# Patient Record
Sex: Male | Born: 1985 | Race: Black or African American | Hispanic: No | Marital: Single | Smoking: Never smoker
Health system: Southern US, Community
[De-identification: ages and names within clinical notes are randomized; demographics above are authoritative.]

---

## 2008-12-17 ENCOUNTER — Emergency Department (HOSPITAL_COMMUNITY): Admission: EM | Admit: 2008-12-17 | Discharge: 2008-12-17 | Payer: Self-pay | Admitting: Family Medicine

## 2010-07-24 ENCOUNTER — Inpatient Hospital Stay (INDEPENDENT_AMBULATORY_CARE_PROVIDER_SITE_OTHER)
Admission: RE | Admit: 2010-07-24 | Discharge: 2010-07-24 | Disposition: A | Discharge status: Home / Self Care | Payer: Self-pay | Source: Ambulatory Visit | Attending: Emergency Medicine | Admitting: Emergency Medicine

## 2010-07-24 DIAGNOSIS — A64 Unspecified sexually transmitted disease: Secondary | ICD-10-CM

## 2010-07-27 LAB — GC/CHLAMYDIA PROBE AMP, GENITAL: Chlamydia, DNA Probe: NEGATIVE

## 2011-06-10 ENCOUNTER — Encounter: Payer: Self-pay | Admitting: Emergency Medicine

## 2011-06-10 ENCOUNTER — Emergency Department
Admission: EM | Admit: 2011-06-10 | Discharge: 2011-06-10 | Disposition: A | Discharge status: Home / Self Care | Payer: Commercial Managed Care - PPO | Source: Home / Self Care | Attending: Family Medicine | Admitting: Family Medicine

## 2011-06-10 DIAGNOSIS — Z113 Encounter for screening for infections with a predominantly sexual mode of transmission: Secondary | ICD-10-CM

## 2011-06-10 NOTE — ED Notes (Signed)
No exposure known but has STD checks q 6 months.

## 2011-06-10 NOTE — ED Provider Notes (Signed)
History     CSN: [id number]  Arrival date & time 06/10/11  1908   First MD Initiated Contact with Patient 06/10/11 1935      Chief Complaint  Patient presents with  . Exposure to STD      HPI Comments: Patient presents for STD testing.  He is completely assymptomatic at present and states that he has a routing GC/chlamydia test every 6 months.  He had a negative HIV test about 8 months ago, and has had negative HSV testing in the past.  He has a regular sexual partner who has also had negative STD testing. He feels well.  Patient is a 26 y.o. male presenting with STD exposure. The history is provided by the patient.  Exposure to STD This is a new problem. Episode frequency: Assymptomatic.    History reviewed. No pertinent past medical history.  History reviewed. No pertinent past surgical history.  History reviewed. No pertinent family history.  History  Substance Use Topics  . Smoking status: Never Smoker   . Smokeless tobacco: Not on file  . Alcohol Use: No      Review of Systems  Genitourinary: Negative for dysuria, urgency, frequency, hematuria, discharge, scrotal swelling, difficulty urinating, genital sores, penile pain and testicular pain.  All other systems reviewed and are negative.    Allergies  Penicillins  Home Medications  No current outpatient prescriptions on file.  BP 150/82  Pulse 60  Temp(Src) 98.1 F (36.7 C) (Oral)  Resp 18  Ht 6\' 2"  (1.88 m)  Wt 215 lb (97.523 kg)  BMI 27.60 kg/m2  SpO2 98%  Physical Exam Nursing notes and Vital Signs reviewed. Appearance:  Patient appears healthy, stated age, and in no acute distress Not examined otherwise  ED Course  Procedures none   Labs Reviewed  GC/CHLAMYDIA PROBE AMP, URINE pending      1. Screen for STD (sexually transmitted disease)       MDM  GC/chlamydia pending.  Patient declines other testing        Donna Christen, MD 06/10/11 [phone]

## 2011-06-11 LAB — GC/CHLAMYDIA PROBE AMP, URINE
Chlamydia, Swab/Urine, PCR: NEGATIVE
GC Probe Amp, Urine: NEGATIVE

## 2011-06-14 ENCOUNTER — Telehealth: Payer: Self-pay | Admitting: Emergency Medicine

## 2012-04-02 ENCOUNTER — Emergency Department (HOSPITAL_COMMUNITY)
Admission: EM | Admit: 2012-04-02 | Discharge: 2012-04-02 | Disposition: A | Discharge status: Home / Self Care | Payer: Commercial Managed Care - PPO | Attending: Emergency Medicine | Admitting: Emergency Medicine

## 2012-04-02 ENCOUNTER — Encounter (HOSPITAL_COMMUNITY): Payer: Self-pay | Admitting: Emergency Medicine

## 2012-04-02 ENCOUNTER — Emergency Department (HOSPITAL_COMMUNITY): Payer: Commercial Managed Care - PPO

## 2012-04-02 DIAGNOSIS — W03XXXA Other fall on same level due to collision with another person, initial encounter: Secondary | ICD-10-CM | POA: Insufficient documentation

## 2012-04-02 DIAGNOSIS — S9305XA Dislocation of left ankle joint, initial encounter: Secondary | ICD-10-CM

## 2012-04-02 DIAGNOSIS — S9306XA Dislocation of unspecified ankle joint, initial encounter: Secondary | ICD-10-CM | POA: Insufficient documentation

## 2012-04-02 DIAGNOSIS — Y9367 Activity, basketball: Secondary | ICD-10-CM | POA: Insufficient documentation

## 2012-04-02 DIAGNOSIS — Y9239 Other specified sports and athletic area as the place of occurrence of the external cause: Secondary | ICD-10-CM | POA: Insufficient documentation

## 2012-04-02 MED ORDER — PROPOFOL 10 MG/ML IV BOLUS
200.0000 mg | Freq: Once | INTRAVENOUS | Status: DC
  Filled 2012-04-02: qty 20

## 2012-04-02 MED ORDER — KETAMINE HCL 10 MG/ML IJ SOLN: 200.0000 mg | Freq: Once | INTRAMUSCULAR | Status: DC

## 2012-04-02 MED ORDER — HYDROMORPHONE HCL PF 1 MG/ML IJ SOLN
1.0000 mg | Freq: Once | INTRAMUSCULAR | Status: AC
  Administered 2012-04-02: 1 mg via INTRAVENOUS
  Filled 2012-04-02: qty 1

## 2012-04-02 MED ORDER — BUPIVACAINE HCL 0.25 % IJ SOLN: 50.0000 mL | Freq: Once | INTRAMUSCULAR | Status: DC

## 2012-04-02 MED ORDER — OXYCODONE-ACETAMINOPHEN 5-325 MG PO TABS: 1.0000 | ORAL_TABLET | Freq: Four times a day (QID) | ORAL | Status: DC | PRN

## 2012-04-02 MED ORDER — PROPOFOL 10 MG/ML IV BOLUS
INTRAVENOUS | Status: AC | PRN
  Administered 2012-04-02: 50 mg via INTRAVENOUS

## 2012-04-02 MED ORDER — BUPIVACAINE HCL (PF) 0.5 % IJ SOLN
INTRAMUSCULAR | Status: AC
  Administered 2012-04-02: 50 mg
  Filled 2012-04-02: qty 40

## 2012-04-02 MED ORDER — IBUPROFEN 800 MG PO TABS: 800.0000 mg | ORAL_TABLET | Freq: Three times a day (TID) | ORAL | Status: DC

## 2012-04-02 MED ORDER — KETAMINE HCL 10 MG/ML IJ SOLN
INTRAMUSCULAR | Status: AC | PRN
  Administered 2012-04-02: 50 mg via INTRAVENOUS

## 2012-04-02 NOTE — Progress Notes (Signed)
Orthopedic Tech Progress Note Patient Details:  Todd Thornton [birth year] [id number]  Ortho Devices Type of Ortho Device: Crutches;Stirrup splint;Post (short) splint;Ace wrap Splint Material: Fiberglass Ortho Device/Splint Location: (L) LE Ortho Device/Splint Interventions: Application   [name] 04/02/2012, 8:36 PM

## 2012-04-02 NOTE — ED Notes (Signed)
Patient playing basketball and fell because another patient went under patient. Left ankle foot obvious deformity. EMS called IV established given 200 mcg fentanyl IV and pain currently 9/10 achy sharp. Pedal pulses +2 able to slight move toes skin warm dry.

## 2012-04-02 NOTE — ED Provider Notes (Signed)
History     CSN: [id number]  Arrival date & time 04/02/12  4098   First MD Initiated Contact with Patient 04/02/12 1855      Chief Complaint  Patient presents with  . Foot Injury    (Consider location/radiation/quality/duration/timing/severity/associated sxs/prior treatment) HPI Comments: Patient was playing basketball and came down on someone else's foot which caused an inversion injury to his left ankle. He has not been able to weight-bear since then. He has an obvious deformity to his left ankle. Skin intact  Patient is a 26 y.o. male presenting with foot injury. The history is provided by the patient. No language interpreter was used.  Foot Injury  The incident occurred less than 1 hour ago. The incident occurred at the gym. The injury mechanism was a fall. The pain is present in the left ankle. The quality of the pain is described as throbbing. The pain is at a severity of 9/10. The pain is severe. The pain has been constant since onset. Associated symptoms include inability to bear weight. Pertinent negatives include no numbness, no loss of sensation and no tingling. He reports no foreign bodies present. The symptoms are aggravated by bearing weight, palpation and activity. He has tried nothing (fentanyl by ems) for the symptoms. The treatment provided no relief.    History reviewed. No pertinent past medical history.  History reviewed. No pertinent past surgical history.  No family history on file.  History  Substance Use Topics  . Smoking status: Never Smoker   . Smokeless tobacco: Not on file  . Alcohol Use: No      Review of Systems  Constitutional: Negative for diaphoresis, activity change and appetite change.  HENT: Negative for sore throat and neck pain.   Eyes: Negative for discharge and visual disturbance.  Respiratory: Negative for cough, choking and shortness of breath.   Cardiovascular: Negative for chest pain and leg swelling.  Gastrointestinal: Negative  for nausea, vomiting, abdominal pain, diarrhea and constipation.  Genitourinary: Negative for dysuria and difficulty urinating.  Musculoskeletal: Positive for joint swelling, arthralgias and gait problem. Negative for back pain.  Skin: Negative for color change and pallor.  Neurological: Negative for dizziness, tingling, speech difficulty, light-headedness and numbness.  Psychiatric/Behavioral: Negative for behavioral problems and agitation.  All other systems reviewed and are negative.    Allergies  Penicillins  Home Medications  No current outpatient prescriptions on file.  BP 124/74  Pulse 66  Temp 97.9 F (36.6 C) (Oral)  Resp 18  SpO2 99%  Physical Exam  Constitutional: He appears well-developed. No distress.  HENT:  Head: Normocephalic and atraumatic.  Mouth/Throat: No oropharyngeal exudate.  Eyes: EOM are normal. Pupils are equal, round, and reactive to light. Right eye exhibits no discharge. Left eye exhibits no discharge.  Neck: Normal range of motion. Neck supple. No JVD present.  Cardiovascular: Normal rate, regular rhythm and normal heart sounds.   Pulmonary/Chest: Effort normal and breath sounds normal. No stridor. No respiratory distress. He has no wheezes. He has no rales. He exhibits no tenderness.  Abdominal: Soft. Bowel sounds are normal. There is no tenderness. There is no guarding.  Genitourinary: Penis normal.  Musculoskeletal: He exhibits tenderness. He exhibits no edema.       Obvious deformity to left ankle, ankle is medially displaced with lateral malleolus protruding, skin intact over it. 2+ dorsalis pedis, 1+ posterior tibial pulse. Sensation intact. Able to wiggle toes. Full range of motion of left knee, no tenderness over the fibular head.  Neurological: He is alert. No cranial nerve deficit. He exhibits normal muscle tone.  Skin: Skin is warm and dry. He is not diaphoretic. No erythema. No pallor.  Psychiatric: He has a normal mood and affect. His  behavior is normal. Judgment and thought content normal.    ED Course  NERVE BLOCK Performed by: Warrick Parisian Authorized by: Warrick Parisian Consent: Verbal consent obtained. Indications: pain relief and dislocation Body area: lower extremity Nerve: deep peroneal Laterality: left Patient sedated: no Patient position: prone Needle gauge: 18 G Location technique: anatomical landmarks and ultrasound guidance Local anesthetic: bupivacaine 0.5% without epinephrine Anesthetic total: 30 ml Outcome: pain unchanged Patient tolerance: Patient tolerated the procedure well with no immediate complications.  ORTHOPEDIC INJURY TREATMENT Performed by: Warrick Parisian Authorized by: Warrick Parisian Consent: Written consent obtained. Risks and benefits: risks, benefits and alternatives were discussed Time out: Immediately prior to procedure a "time out" was called to verify the correct patient, procedure, equipment, support staff and site/side marked as required. Injury location: ankle Location details: left ankle Injury type: dislocation Dislocation type: lateral Pre-procedure neurovascular assessment: neurovascularly intact Pre-procedure distal perfusion: normal Pre-procedure neurological function: normal Pre-procedure range of motion: reduced Local anesthesia used: no Patient sedated: yes Sedation type: moderate (conscious) sedation Sedatives: propofol and ketamine Analgesia: ketamine Vitals: Vital signs were monitored during sedation. Manipulation performed: yes Reduction method: direct traction Reduction successful: yes X-ray confirmed reduction: yes Immobilization: splint Splint type: ankle stirrup Supplies used: Ortho-Glass Post-procedure neurovascular assessment: post-procedure neurovascularly intact Post-procedure distal perfusion: normal Post-procedure neurological function: normal Post-procedure range of motion: normal Patient tolerance: Patient tolerated the procedure  well with no immediate complications.  Procedural sedation Performed by: Warrick Parisian Authorized by: Warrick Parisian Consent: Verbal consent obtained. Risks and benefits: risks, benefits and alternatives were discussed Time out: Immediately prior to procedure a "time out" was called to verify the correct patient, procedure, equipment, support staff and site/side marked as required. Patient sedated: yes Sedation type: moderate (conscious) sedation Sedatives: ketamine and propofol Analgesia: ketamine Vitals: Vital signs were monitored during sedation. Patient tolerance: Patient tolerated the procedure well with no immediate complications.   (including critical care time)  Labs Reviewed - No data to display Dg Ankle 2 Views Left  04/02/2012  *RADIOLOGY REPORT*  Clinical Data: Injured ankle playing basketball.  LEFT ANKLE - 2 VIEW  Comparison: None.  Findings: Limited examination due to positioning. The medial subtalar dislocation is most likely.  No obvious fractures.  IMPRESSION: Subtalar dislocation.   Original Report Authenticated By: Rudie Meyer, M.D.      1. Closed dislocation of left ankle       MDM  The regional nerve block but patient did not have any relief of pain or decreased sensation afterwards. Procedural sedation was performed which the patient tolerated well, he also tolerated ankle reduction while. Successful reduction confirmed by postprocedural x-ray. No fracture. Splint placed, orthopedic surgery followup arranged. Neurovascularly intact before and after the reduction. Skin was closed over injury. Pt deemed stable for discharge. Return precautions were provided and pt expressed understanding to return to ED if any acute symptoms return. Follow up was instructed which pt also expressed understanding. All questions were answered and pt was in agreement w/ plan.         Warrick Parisian, MD 04/02/12 [phone]

## 2012-04-02 NOTE — ED Notes (Signed)
Tammy Sours RN triaged, obtained vital signs, and assessed patient during triage.

## 2012-04-12 NOTE — ED Provider Notes (Signed)
I saw and evaluated the patient, reviewed the resident's note and I agree with the findings and plan.  26 year old male with closed ankle dislocation. This was reduced. Patient splinted. Neurovascular intact after reduction. Repeat imaging confirms adequate reduction. Outpatient followup with orthopedic surgery.  Raeford Razor, MD 04/12/12 [phone]

## 2012-04-12 NOTE — ED Provider Notes (Signed)
I saw and evaluated the patient, reviewed the resident's note and I agree with the findings and plan.  Please see my completed note for this encounter.  Raeford Razor, MD 04/12/12 [phone]

## 2013-10-25 ENCOUNTER — Emergency Department (HOSPITAL_COMMUNITY): Payer: 59

## 2013-10-25 ENCOUNTER — Encounter (HOSPITAL_COMMUNITY): Payer: Self-pay | Admitting: Emergency Medicine

## 2013-10-25 ENCOUNTER — Emergency Department (HOSPITAL_COMMUNITY)
Admission: EM | Admit: 2013-10-25 | Discharge: 2013-10-26 | Disposition: A | Discharge status: Home / Self Care | Payer: 59 | Attending: Emergency Medicine | Admitting: Emergency Medicine

## 2013-10-25 DIAGNOSIS — S82899A Other fracture of unspecified lower leg, initial encounter for closed fracture: Secondary | ICD-10-CM | POA: Insufficient documentation

## 2013-10-25 DIAGNOSIS — S93315A Dislocation of tarsal joint of left foot, initial encounter: Secondary | ICD-10-CM

## 2013-10-25 DIAGNOSIS — S82892A Other fracture of left lower leg, initial encounter for closed fracture: Secondary | ICD-10-CM

## 2013-10-25 DIAGNOSIS — Y92838 Other recreation area as the place of occurrence of the external cause: Secondary | ICD-10-CM

## 2013-10-25 DIAGNOSIS — S9306XA Dislocation of unspecified ankle joint, initial encounter: Secondary | ICD-10-CM | POA: Insufficient documentation

## 2013-10-25 DIAGNOSIS — Y9367 Activity, basketball: Secondary | ICD-10-CM | POA: Insufficient documentation

## 2013-10-25 DIAGNOSIS — Z880 Allergy status to penicillin: Secondary | ICD-10-CM | POA: Insufficient documentation

## 2013-10-25 DIAGNOSIS — Y9239 Other specified sports and athletic area as the place of occurrence of the external cause: Secondary | ICD-10-CM | POA: Insufficient documentation

## 2013-10-25 DIAGNOSIS — Z791 Long term (current) use of non-steroidal anti-inflammatories (NSAID): Secondary | ICD-10-CM | POA: Insufficient documentation

## 2013-10-25 DIAGNOSIS — X500XXA Overexertion from strenuous movement or load, initial encounter: Secondary | ICD-10-CM | POA: Insufficient documentation

## 2013-10-25 MED ORDER — PROPOFOL 10 MG/ML IV BOLUS
0.5000 mg/kg | Freq: Once | INTRAVENOUS | Status: AC
  Administered 2013-10-25: 54.5 mg via INTRAVENOUS
  Filled 2013-10-25: qty 1

## 2013-10-25 MED ORDER — PROPOFOL 10 MG/ML IV BOLUS
INTRAVENOUS | Status: AC | PRN
  Administered 2013-10-25: 50 mg via INTRAVENOUS

## 2013-10-25 MED ORDER — HYDROMORPHONE HCL PF 1 MG/ML IJ SOLN
1.0000 mg | Freq: Once | INTRAMUSCULAR | Status: AC
  Administered 2013-10-25: 1 mg via INTRAVENOUS
  Filled 2013-10-25: qty 1

## 2013-10-25 NOTE — ED Notes (Signed)
Per EMS: Pt was playing basketball, tripped on someone and rolled his L ankle. Deformity noted to L ankle. Prior fracture to L ankle.

## 2013-10-25 NOTE — ED Provider Notes (Signed)
CSN: [id number]     Arrival date & time 10/25/13  2033 History   First MD Initiated Contact with Patient 10/25/13 2048     Chief Complaint  Patient presents with  . Ankle Pain   HPI  Todd Thornton is a 28 y.o. male with no PMH who presents to the ED for evaluation of ankle pain. History was provided by the patient. Patient states he was playing basketball this evening [employer]TA and rolled his ankle. No other injuries. No numbness, tingling, loss of sensation or weakness. No head injury or LOC. Provided pain medication by EMS but this is starting to subside and his pain is returning. Has had an ankle fracture and dislocation on the left once in the past. No PMH.     History reviewed. No pertinent past medical history. History reviewed. No pertinent past surgical history. No family history on file. History  Substance Use Topics  . Smoking status: Never Smoker   . Smokeless tobacco: Not on file  . Alcohol Use: No    Review of Systems  Cardiovascular: Negative for leg swelling.  Musculoskeletal: Positive for arthralgias, gait problem and joint swelling.  Skin: Negative for color change and wound.  Neurological: Negative for weakness and numbness.    Allergies  Penicillins  Home Medications   Prior to Admission medications   Medication Sig Start Date End Date Taking? Authorizing Provider  ibuprofen (ADVIL,MOTRIN) 800 MG tablet Take 1 tablet (800 mg total) by mouth 3 (three) times daily. 04/02/12   Warrick Parisian, MD  oxyCODONE-acetaminophen (PERCOCET/ROXICET) 5-325 MG per tablet Take 1-2 tablets by mouth every 6 (six) hours as needed for pain. 04/02/12   Warrick Parisian, MD   BP 146/85  Pulse 83  Temp(Src) 98.2 F (36.8 C) (Oral)  Resp 16  Ht 6\' 3"  (1.905 m)  Wt 240 lb (108.863 kg)  BMI 30.00 kg/m2  SpO2 100%  Filed Vitals:   10/25/13 2236 10/25/13 2237 10/25/13 2238 10/26/13 0046  BP:    146/85  Pulse: 95 81 81 83  Temp:    98.2 F (36.8 C)  TempSrc:      Resp: 15 11 16 16    Height:      Weight:      SpO2: 100% 98% 100% 100%    Physical Exam  Nursing note and vitals reviewed. Constitutional: He is oriented to person, place, and time. He appears well-developed and well-nourished. No distress.  HENT:  Head: Normocephalic and atraumatic.  Right Ear: External ear normal.  Left Ear: External ear normal.  Mouth/Throat: Oropharynx is clear and moist.  Eyes: Conjunctivae are normal. Right eye exhibits no discharge. Left eye exhibits no discharge.  Neck: Normal range of motion.  Cardiovascular: Normal rate.   Dorsalis pedis pulses present and equal bilaterally  Pulmonary/Chest: Effort normal.  Abdominal: Soft.  Musculoskeletal: Normal range of motion. He exhibits edema and tenderness.  Obvious deformity to the left ankle. Left foot is displaced and angled inwards medially. Patient able to flex and extend digits of left foot. No foot or knee tenderness on the left. Mild edema to the left lateral ankle.   Neurological: He is alert and oriented to person, place, and time.  Sensation intact in the LE bilaterally  Skin: Skin is warm and dry. He is not diaphoretic.  No open wounds or lacerations     ED Course  Procedures (including critical care time) Labs Review Labs Reviewed - No data to display  Imaging Review Dg Ankle Complete Left  10/25/2013   CLINICAL DATA:  Trauma.  EXAM: LEFT ANKLE COMPLETE - 3+ VIEW  COMPARISON:  Left ankle series 11 07/2011.  FINDINGS: Deformity is noted about the left ankle. Fracture of the talus with talocalcaneal dislocation is suspected. CT left ankle is suggested for further evaluation.  IMPRESSION: Severe deformity left ankle. Fracture of the talus is suspected. Talocalcaneal dislocation is suspected. CT should be considered for further evaluation.   Electronically Signed   By: Maisie Fus  Register   On: 10/25/2013 21:28   Ct Ankle Left Wo Contrast  10/25/2013   CLINICAL DATA:  Left ankle pain, status post fall and reduction of  subtalar joint dislocation.  EXAM: CT OF THE LEFT ANKLE WITHOUT CONTRAST  TECHNIQUE: Multidetector CT imaging was performed according to the standard protocol. Multiplanar CT image reconstructions were also generated.  COMPARISON:  Right ankle radiographs performed earlier today at 8:55 p.m.  FINDINGS: The patient is status post reduction of the previously noted subtalar joint dislocation. Several tiny avulsion fracture fragments are noted about the ankle joint, with a tiny fragment adjacent to the medial malleolus, a tiny fragment adjacent to the posterior aspect of the medial talus, and two tiny fragments adjacent to the sinus tarsi. The sources of these fracture fragments are not well assessed.  There is also minimal cortical irregularity involving the articular surface of the talus at the lateral aspect of the posterior facet, with a tiny associated fragment at the joint space. No additional fractures are identified. The ankle mortise is grossly unremarkable in appearance. The subtalar joint demonstrates normal alignment.  Prominent soft tissue edema is noted within the sinus are seen. Significant soft tissue edema is noted about the lateral aspect of the ankle joint. The flexor and extensor tendons appear grossly intact. The peroneus tendons are grossly unremarkable in appearance. The Achilles tendon remains intact. There is no definite evidence of vascular disruption. Scattered tiny foci of air along the dorsum of the ankle are thought reflect air arising from the joint space.  IMPRESSION: 1. Status post reduction of subtalar joint dislocation. Several tiny avulsion fracture fragments noted about the ankle joint, with a tiny fragment adjacent to the medial malleolus, a tiny fragment adjacent to the posterior aspect of the medial talus, and two tiny fragments adjacent to the sinus tarsi. 2. Minimal cortical irregularity involving the articular surface of the talus, at the lateral aspect of the posterior facet  of the subtalar joint, with a tiny associated fragment at the joint space. 3. Prominent soft tissue edema within the sinus tarsi and about the lateral aspect of the ankle joint.   Electronically Signed   By: Roanna Raider M.D.   On: 10/25/2013 23:55     EKG Interpretation None      MDM   Todd Thornton is a 28 y.o. male with no PMH who presents to the ED for evaluation of ankle pain. Patient found to have a subtalar dislocation, which was successfully reduced in the ED under conscious sedation. CT showed several avulsion fractures of the ankle joint. Patient neurovascularly intact. Patient splinted and given crutches. Will follow-up with orthopedics in clinic. RICE method discussed. Return precautions, discharge instructions, and follow-up was discussed with the patient before discharge.     Consults  Dr. Rubin Payor spoke with Dr. Roda Shutters who agrees with reduction in the ED and follow-up CT of the ankle. Follow-up in clinic.   Discharge Medication List as of 10/26/2013 12:22 AM    START taking these medications  Details  oxyCODONE-acetaminophen (PERCOCET/ROXICET) 5-325 MG per tablet Take 2 tablets by mouth every 4 (four) hours as needed for severe pain., Starting 10/26/2013, Until Discontinued, Print         Final impressions: 1. Dislocation of left subtalar joint   2. Avulsion fracture of left ankle      Luiz IronJessica Katlin Marsel Gail PA-C   This patient was discussed with Dr. Vickey SagesPickering         Charleene Callegari K Matan Steen, PA-C 10/26/13 [phone]

## 2013-10-25 NOTE — ED Notes (Signed)
Per EMS: Pt initially c/o 10/10 pain, given a total of 250 mcg Fentanyl, pain now 4/10.

## 2013-10-25 NOTE — ED Notes (Signed)
Bed: WA04 Expected date:  Expected time:  Means of arrival:  Comments: EMS 

## 2013-10-26 MED ORDER — OXYCODONE-ACETAMINOPHEN 5-325 MG PO TABS: 2.0000 | ORAL_TABLET | ORAL | Status: DC | PRN

## 2013-10-26 NOTE — Discharge Instructions (Signed)
Use RICE method - see below Percocet for severe pain - Please be careful with this medication.  It can cause drowsiness.  Use caution while driving, operating machinery, drinking alcohol, or any other activities that may impair your physical or mental abilities.   Ibuprofen will help with inflammation and swelling  Use crutches  Return to the emergency department if you develop any changing/worsening condition or any other concerns (please read additional information regarding your condition below)   Ankle Dislocation Ankle dislocation happens when the ankle bones move out of place. Usually, the injury that causes ankle dislocation also causes other injuries that are more serious. Often these injuries are broken ankle bones. You also may have injured nerves and blood vessels.  Your doctor will put your ankle back in place. Any tears on your skin around your ankle will be closed (this is usually done in surgery). A cast or splint will be placed around your ankle to hold it in place while it heals. Sometimes, screws and plates need to be drilled into your ankle bones to hold your ankle in place. Sometimes, pins are drilled into the bones of your lower leg and foot. These pins attach to a metal bar outside your body. The pins and the bar hold your bones in place until surgery can be done. HOME CARE  Rest your injured joint. Do not move it.  Put ice on your injured joint for 1 to 2 days or as told by your doctor.  Put ice in a plastic bag.  Place a towel between your skin and the bag.  Leave the ice on for 15 to 20 minutes, every 2 hours while your are awake.  Raise your ankle above your heart as told by your doctor. This helps limit puffiness.  Move your toes as told by your doctor so they do not get stiff.  Only take medicines as told by your doctor. GET HELP RIGHT AWAY IF:  Your cast or splint becomes loose or damaged.  Your screws, plates, or the metal bar outside your body becomes  loose or damaged.  You notice fluid draining around any pins.  Your pain becomes worse, not better.  You lose feeling in your toe or cannot bend the tip of your toe. MAKE SURE YOU:   Understand these instructions.  Will watch your condition.  Will get help right away if you are not doing well or get worse. Document Released: 01/13/2011 Document Revised: 08/09/2011 Document Reviewed: 01/13/2011 [facility] Patient Information 2014 [place], [place].   Ankle Fracture A fracture is a break in the bone. A cast or splint is used to protect and keep your injured bone from moving.  HOME CARE INSTRUCTIONS   Use your crutches as directed.  To lessen the swelling, keep the injured leg elevated while sitting or lying down.  Apply ice to the injury for 15-20 minutes, 03-04 times per day while awake for 2 days. Put the ice in a plastic bag and place a thin towel between the bag of ice and your cast.  If you have a plaster or fiberglass cast:  Do not try to scratch the skin under the cast using sharp or pointed objects.  Check the skin around the cast every day. You may put lotion on any red or sore areas.  Keep your cast dry and clean.  If you have a plaster splint:  Wear the splint as directed.  You may loosen the elastic around the splint if your toes become  numb, tingle, or turn cold or blue.  Do not put pressure on any part of your cast or splint; it may break. Rest your cast only on a pillow the first 24 hours until it is fully hardened.  Your cast or splint can be protected during bathing with a plastic bag. Do not lower the cast or splint into water.  Take medications as directed by your caregiver. Only take over-the-counter or prescription medicines for pain, discomfort, or fever as directed by your caregiver.  Do not drive a vehicle until your caregiver specifically tells you it is safe to do so.  If your caregiver has given you a follow-up appointment, it is very important  to keep that appointment. Not keeping the appointment could result in a chronic or permanent injury, pain, and disability. If there is any problem keeping the appointment, you must call back to this facility for assistance. SEEK IMMEDIATE MEDICAL CARE IF:   Your cast gets damaged or breaks.  You have continued severe pain or more swelling than you did before the cast was put on.  Your skin or toenails below the injury turn blue or gray, or feel cold or numb.  There is a bad smell or new stains and/or purulent (pus like) drainage coming from under the cast. If you do not have a window in your cast for observing the wound, a discharge or minor bleeding may show up as a stain on the outside of your cast. Report these findings to your caregiver. MAKE SURE YOU:   Understand these instructions.  Will watch your condition.  Will get help right away if you are not doing well or get worse. Document Released: 05/14/2000 Document Revised: 08/09/2011 Document Reviewed: 12/14/2012 [facility] Patient Information 2014 [place], [place].  RICE: Routine Care for Injuries The routine care of many injuries includes Rest, Ice, Compression, and Elevation (RICE). HOME CARE INSTRUCTIONS  Rest is needed to allow your body to heal. Routine activities can usually be resumed when comfortable. Injured tendons and bones can take up to 6 weeks to heal. Tendons are the cord-like structures that attach muscle to bone.  Ice following an injury helps keep the swelling down and reduces pain.  Put ice in a plastic bag.  Place a towel between your skin and the bag.  Leave the ice on for 15-20 minutes, 03-04 times a day. Do this while awake, for the first 24 to 48 hours. After that, continue as directed by your caregiver.  Compression helps keep swelling down. It also gives support and helps with discomfort. If an elastic bandage has been applied, it should be removed and reapplied every 3 to 4 hours. It should not be  applied tightly, but firmly enough to keep swelling down. Watch fingers or toes for swelling, bluish discoloration, coldness, numbness, or excessive pain. If any of these problems occur, remove the bandage and reapply loosely. Contact your caregiver if these problems continue.  Elevation helps reduce swelling and decreases pain. With extremities, such as the arms, hands, legs, and feet, the injured area should be placed near or above the level of the heart, if possible. SEEK IMMEDIATE MEDICAL CARE IF:  You have persistent pain and swelling.  You develop redness, numbness, or unexpected weakness.  Your symptoms are getting worse rather than improving after several days. These symptoms may indicate that further evaluation or further X-rays are needed. Sometimes, X-rays may not show a small broken bone (fracture) until 1 week or 10 days later. Make a  follow-up appointment with your caregiver. Ask when your X-ray results will be ready. Make sure you get your X-ray results. Document Released: 08/29/2000 Document Revised: 08/09/2011 Document Reviewed: 10/16/2010 [facility] Patient Information 2014 [place].

## 2013-10-29 NOTE — ED Provider Notes (Signed)
Medical screening examination/treatment/procedure(s) were performed by non-physician practitioner and as supervising physician I was immediately available for consultation/collaboration.   EKG Interpretation None       Juliet Rude. Rubin Payor, MD 10/29/13 1534

## 2013-11-07 NOTE — ED Provider Notes (Signed)
  Physical Exam  BP 146/85  Pulse 83  Temp(Src) 98.2 F (36.8 C) (Oral)  Resp 16  Ht 6\' 3"  (1.905 m)  Wt 240 lb (108.863 kg)  BMI 30.00 kg/m2  SpO2 100%  Physical Exam  ED Course  ORTHOPEDIC INJURY TREATMENT Date/Time: 11/07/2013 2:51 PM Performed by: Benjiman Core R. Authorized by: Billee Cashing Consent: Verbal consent obtained. written consent obtained. Risks and benefits: risks, benefits and alternatives were discussed Consent given by: patient Patient understanding: patient states understanding of the procedure being performed Patient consent: the patient's understanding of the procedure matches consent given Procedure consent: procedure consent matches procedure scheduled Relevant documents: relevant documents present and verified Imaging studies: imaging studies available Required items: required blood products, implants, devices, and special equipment available Patient identity confirmed: verbally with patient and arm band Time out: Immediately prior to procedure a "time out" was called to verify the correct patient, procedure, equipment, support staff and site/side marked as required. Injury location: foot Location details: left foot Injury type: fracture-dislocation Pre-procedure neurovascular assessment: neurovascularly intact Pre-procedure distal perfusion: normal Pre-procedure neurological function: normal Pre-procedure range of motion: reduced Local anesthesia used: no Patient sedated: yes Sedation type: moderate (conscious) sedation Sedatives: propofol Analgesia: 5 minutes of sedation. Manipulation performed: yes Reduction successful: yes X-ray confirmed reduction: yes Immobilization: splint Splint type: short leg Post-procedure neurovascular assessment: post-procedure neurovascularly intact Post-procedure distal perfusion: normal Post-procedure neurological function: normal Post-procedure range of motion: improved    MDM Patient with a  subtalar dislocation. Reduced in the ED after discussion with orthopedic surger      Harrold Donath R. Rubin Payor, MD 11/07/13 1454

## 2016-04-09 ENCOUNTER — Encounter (HOSPITAL_COMMUNITY): Payer: Self-pay | Admitting: *Deleted

## 2016-04-09 ENCOUNTER — Emergency Department (HOSPITAL_COMMUNITY): Payer: No Typology Code available for payment source

## 2016-04-09 ENCOUNTER — Emergency Department (HOSPITAL_COMMUNITY)
Admission: EM | Admit: 2016-04-09 | Discharge: 2016-04-10 | Disposition: A | Discharge status: Home / Self Care | Payer: No Typology Code available for payment source | Attending: Emergency Medicine | Admitting: Emergency Medicine

## 2016-04-09 DIAGNOSIS — R52 Pain, unspecified: Secondary | ICD-10-CM

## 2016-04-09 DIAGNOSIS — S80212A Abrasion, left knee, initial encounter: Secondary | ICD-10-CM | POA: Diagnosis not present

## 2016-04-09 DIAGNOSIS — Y92410 Unspecified street and highway as the place of occurrence of the external cause: Secondary | ICD-10-CM | POA: Insufficient documentation

## 2016-04-09 DIAGNOSIS — Z23 Encounter for immunization: Secondary | ICD-10-CM | POA: Diagnosis not present

## 2016-04-09 DIAGNOSIS — Y9389 Activity, other specified: Secondary | ICD-10-CM | POA: Diagnosis not present

## 2016-04-09 DIAGNOSIS — S43005A Unspecified dislocation of left shoulder joint, initial encounter: Secondary | ICD-10-CM | POA: Diagnosis not present

## 2016-04-09 DIAGNOSIS — S80211A Abrasion, right knee, initial encounter: Secondary | ICD-10-CM | POA: Diagnosis not present

## 2016-04-09 DIAGNOSIS — S9002XA Contusion of left ankle, initial encounter: Secondary | ICD-10-CM | POA: Diagnosis not present

## 2016-04-09 DIAGNOSIS — T07XXXA Unspecified multiple injuries, initial encounter: Secondary | ICD-10-CM

## 2016-04-09 DIAGNOSIS — S93602A Unspecified sprain of left foot, initial encounter: Secondary | ICD-10-CM | POA: Insufficient documentation

## 2016-04-09 DIAGNOSIS — Y999 Unspecified external cause status: Secondary | ICD-10-CM | POA: Diagnosis not present

## 2016-04-09 DIAGNOSIS — S4992XA Unspecified injury of left shoulder and upper arm, initial encounter: Secondary | ICD-10-CM | POA: Diagnosis present

## 2016-04-09 MED ORDER — PROPOFOL 10 MG/ML IV BOLUS
0.5000 mg/kg | Freq: Once | INTRAVENOUS | Status: DC
  Filled 2016-04-09: qty 20

## 2016-04-09 MED ORDER — HYDROMORPHONE HCL 1 MG/ML IJ SOLN
1.0000 mg | Freq: Once | INTRAMUSCULAR | Status: AC
  Administered 2016-04-09: 1 mg via INTRAVENOUS
  Filled 2016-04-09: qty 1

## 2016-04-09 MED ORDER — KETAMINE HCL-SODIUM CHLORIDE 100-0.9 MG/10ML-% IV SOSY
1.0000 mg/kg | PREFILLED_SYRINGE | Freq: Once | INTRAVENOUS | Status: AC
  Administered 2016-04-09: 100 mg via INTRAVENOUS
  Filled 2016-04-09: qty 20

## 2016-04-09 MED ORDER — TETANUS-DIPHTH-ACELL PERTUSSIS 5-2.5-18.5 LF-MCG/0.5 IM SUSP
0.5000 mL | Freq: Once | INTRAMUSCULAR | Status: AC
  Administered 2016-04-09: 0.5 mL via INTRAMUSCULAR
  Filled 2016-04-09: qty 0.5

## 2016-04-09 MED ORDER — ONDANSETRON HCL 4 MG/2ML IJ SOLN: 4.0000 mg | Freq: Once | INTRAMUSCULAR | Status: DC

## 2016-04-09 NOTE — ED Notes (Signed)
Patient transported to X-ray 
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
## 2016-04-09 NOTE — ED Notes (Signed)
XR at bedside

## 2016-04-09 NOTE — ED Notes (Signed)
Pt placed back on to stretcher.  Dr. Rhunette CroftNanavati @ [facility].
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
## 2016-04-09 NOTE — ED Notes (Signed)
Pt denies LOC/SOB/headache

## 2016-04-09 NOTE — ED Notes (Signed)
Pt brought back to room from CT.  Tech stated "pt is unable to get the shoulder down".  Tresa EndoKelly, PA-C informed, new orders received.

## 2016-04-10 MED ORDER — HYDROCODONE-ACETAMINOPHEN 5-325 MG PO TABS: 1.0000 | ORAL_TABLET | Freq: Four times a day (QID) | ORAL | 0 refills | Status: DC | PRN

## 2016-04-10 MED ORDER — IBUPROFEN 600 MG PO TABS: 600.0000 mg | ORAL_TABLET | Freq: Four times a day (QID) | ORAL | 0 refills | Status: AC | PRN

## 2016-04-10 MED ORDER — DIAZEPAM 2 MG PO TABS: 2.0000 mg | ORAL_TABLET | Freq: Four times a day (QID) | ORAL | 0 refills | Status: DC | PRN

## 2016-04-10 NOTE — Discharge Instructions (Signed)
Take ibuprofen as prescribed for pain. You may take Valium as needed for muscle spasms and Norco as needed for severe pain. Keep your arm in a shoulder sling until you are able to see an orthopedic specialist. Call in the morning to make an appointment.

## 2016-04-10 NOTE — ED Notes (Signed)
Pt to CT

## 2016-06-02 ENCOUNTER — Other Ambulatory Visit: Payer: Self-pay | Admitting: Sports Medicine

## 2016-06-02 DIAGNOSIS — M25512 Pain in left shoulder: Secondary | ICD-10-CM

## 2016-06-05 ENCOUNTER — Other Ambulatory Visit: Payer: Self-pay | Admitting: Sports Medicine

## 2016-06-05 ENCOUNTER — Ambulatory Visit
Admission: RE | Admit: 2016-06-05 | Discharge: 2016-06-05 | Disposition: A | Discharge status: Home / Self Care | Payer: BLUE CROSS/BLUE SHIELD | Source: Ambulatory Visit | Attending: Sports Medicine | Admitting: Sports Medicine

## 2016-06-05 DIAGNOSIS — M25512 Pain in left shoulder: Secondary | ICD-10-CM

## 2016-06-12 ENCOUNTER — Ambulatory Visit
Admission: RE | Admit: 2016-06-12 | Discharge: 2016-06-12 | Disposition: A | Discharge status: Home / Self Care | Payer: BLUE CROSS/BLUE SHIELD | Source: Ambulatory Visit | Attending: Sports Medicine | Admitting: Sports Medicine

## 2016-06-12 DIAGNOSIS — M25512 Pain in left shoulder: Secondary | ICD-10-CM

## 2017-10-25 IMAGING — MR MR SHOULDER*L* W/O CM
5 series · 34 of 40 positions shown · non-contrast
Comparison: 04/09/2016

CLINICAL DATA: Left shoulder pain for the last 2 months with
limited range of motion. Glenohumeral dislocation on 04/09/2016

EXAM:
MRI OF THE LEFT SHOULDER WITHOUT CONTRAST
TECHNIQUE: Multiplanar, multisequence MR imaging of the shoulder was performed.
No intravenous contrast was administered.

[Series 3: T2 fat-sat · axial · 4.0mm · 0.55mm/px · z∈[-65,+24]mm · 8 of 22 slices shown (1 of 3)]
[im 1/22]
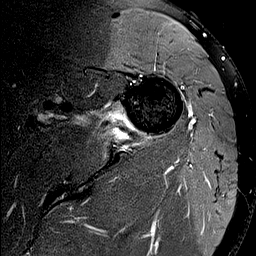
[im 4/22]
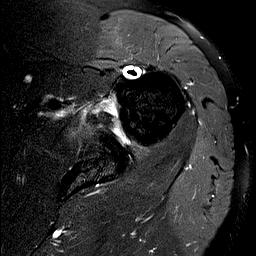
[im 7/22]
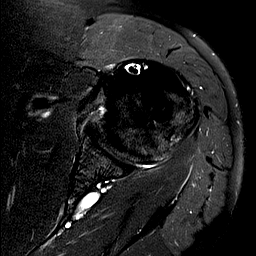
[im 10/22]
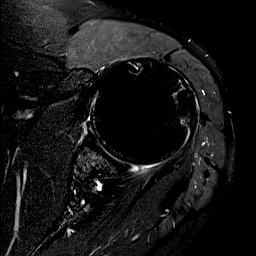
[im 13/22]
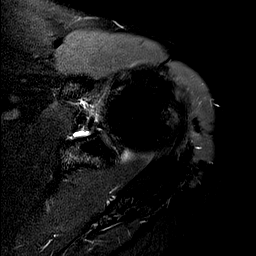
[im 16/22]
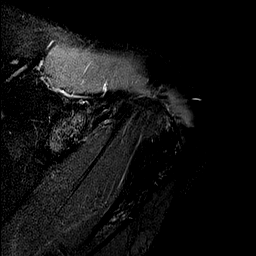
[im 19/22]
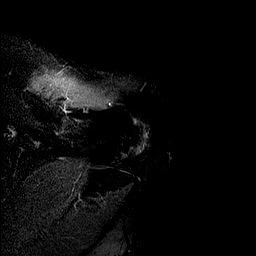
[im 22/22]
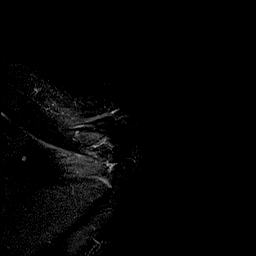

[Series 4: T2 fat-sat · oblique · 4.0mm · 0.59mm/px · 8 of 20 slices shown (2 of 3)]
[im 1/20]
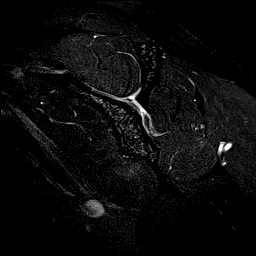
[im 3/20]
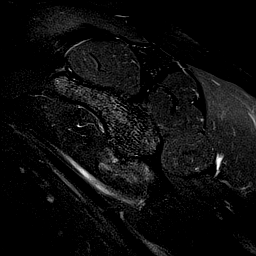
[im 6/20]
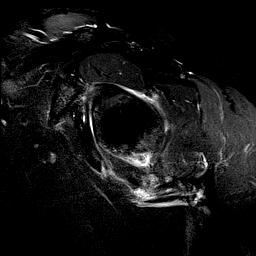
[im 9/20]
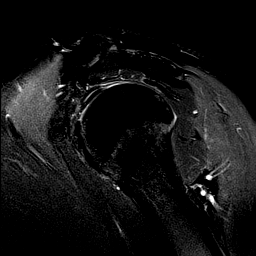
[im 11/20]
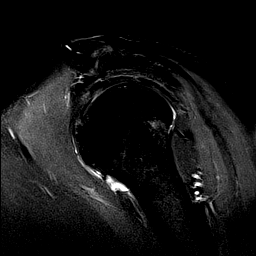
[im 14/20]
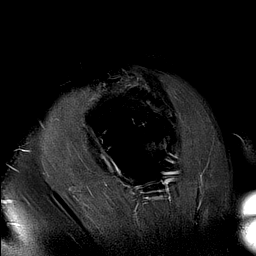
[im 17/20]
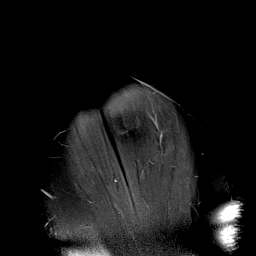
[im 20/20]
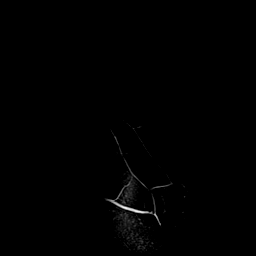

[Series 5: T1 · oblique · 4.0mm · 0.22mm/px · 2 of 20 slices shown]
[im 1/20]
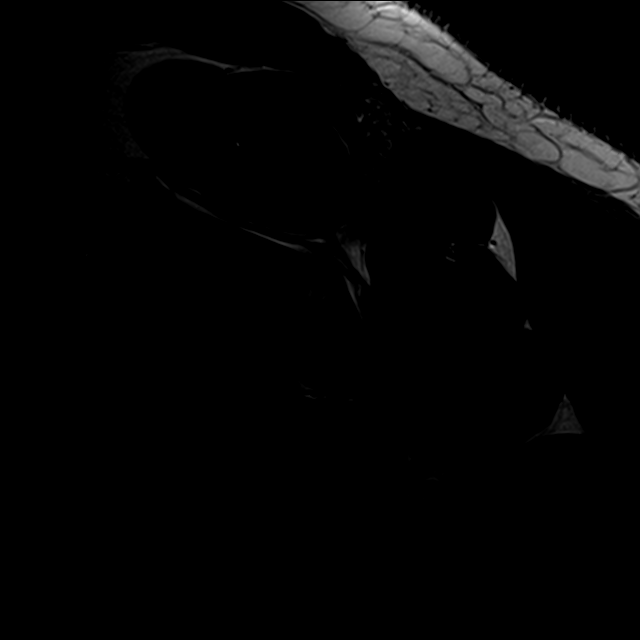
[im 3/20]
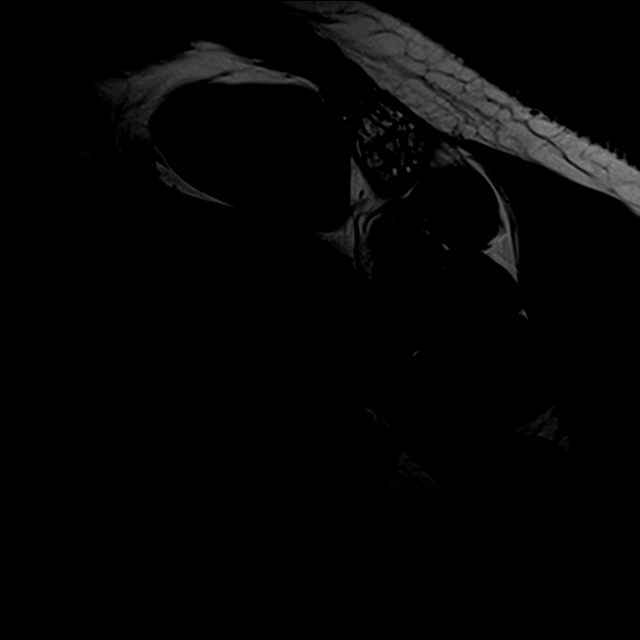

[Series 6: T2 fat-sat · oblique · 4.0mm · 0.29mm/px · 8 of 20 slices shown (3 of 3)]
[im 1/20]
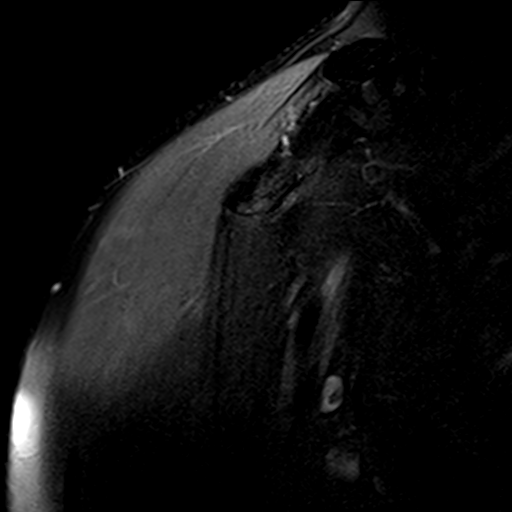
[im 3/20]
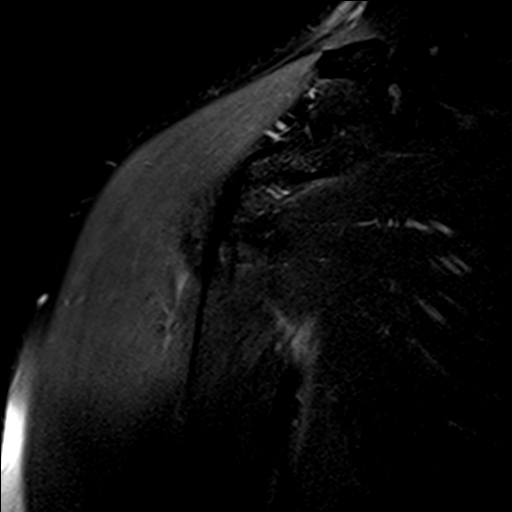
[im 6/20]
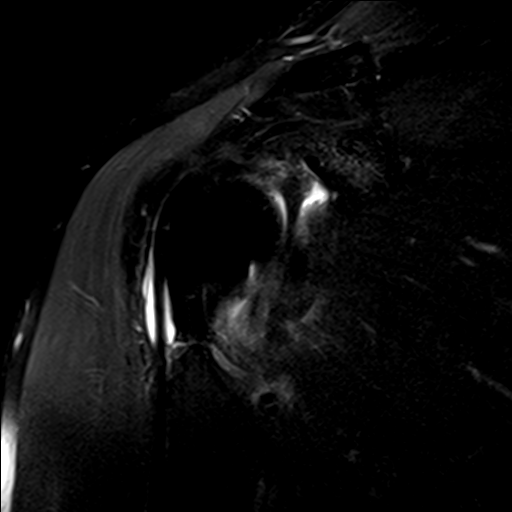
[im 9/20]
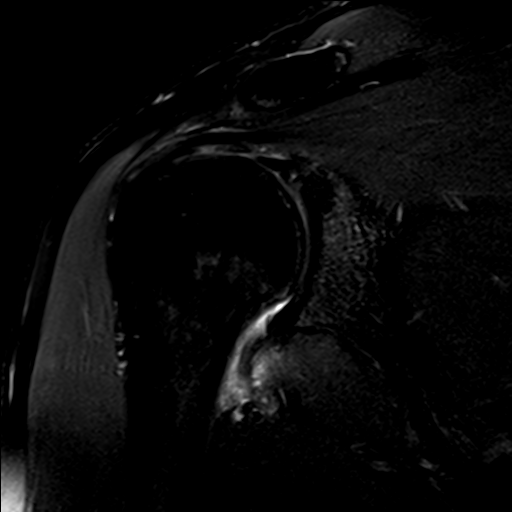
[im 11/20]
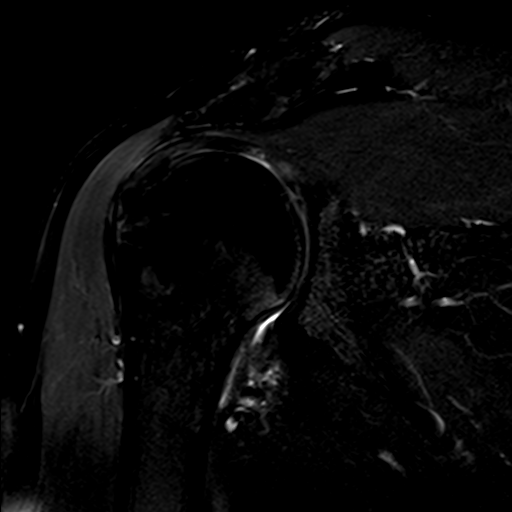
[im 14/20]
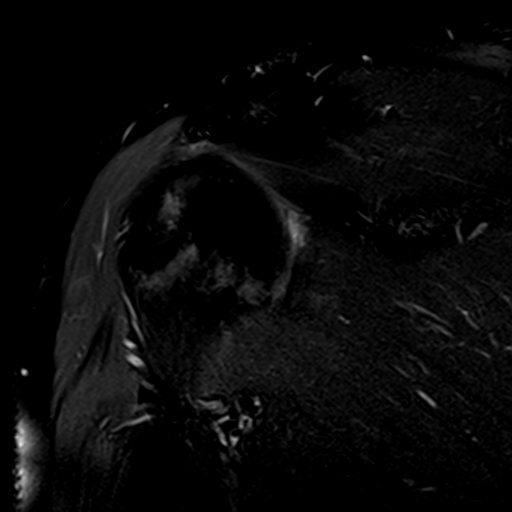
[im 17/20]
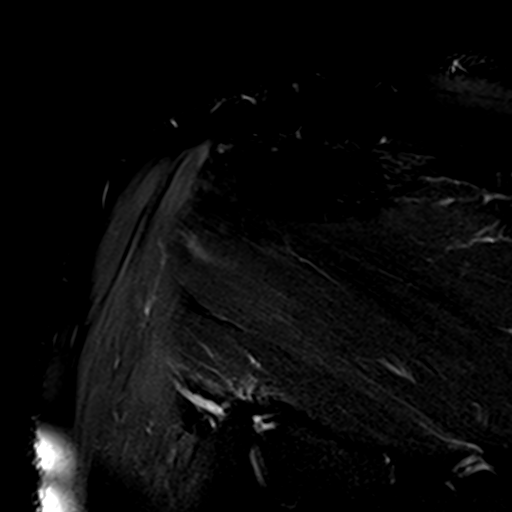
[im 20/20]
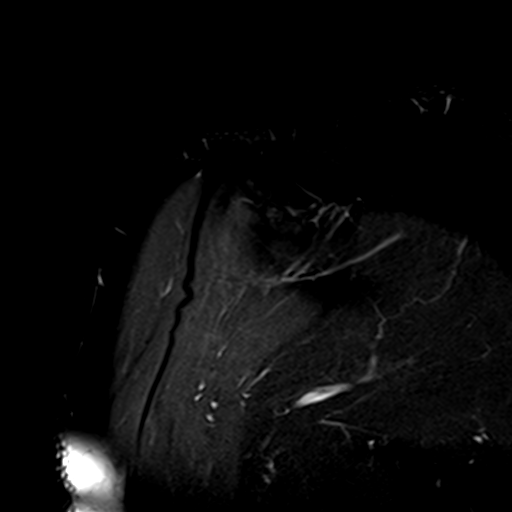

[Series 7: PD · oblique · 4.0mm · 0.59mm/px · 8 of 20 slices shown]
[im 1/20]
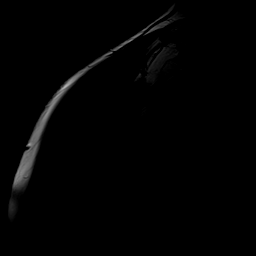
[im 3/20]
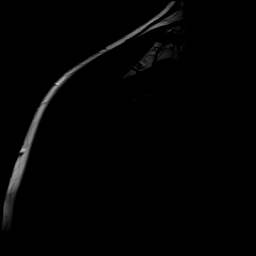
[im 6/20]
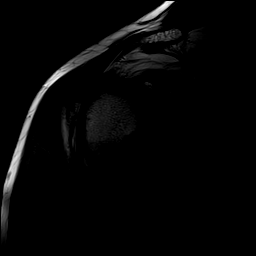
[im 9/20]
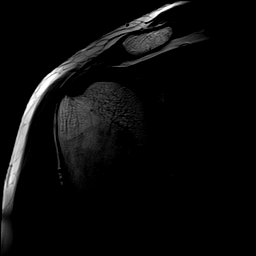
[im 11/20]
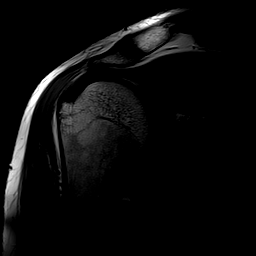
[im 14/20]
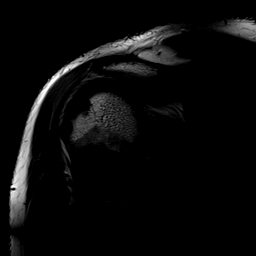
[im 17/20]
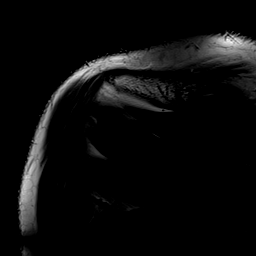
[im 20/20]
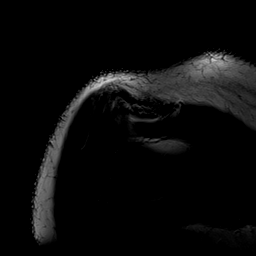

[34 of 40 positions shown; findings below may reference images not displayed]

FINDINGS: Rotator cuff:  Mild supraspinatus tendinopathy.

Muscles:  Accentuated T2 signal in most of the deltoid muscle.

Biceps long head:  Mild tendinopathy of the intra-articular segment.

Acromioclavicular Joint: No arthropathy. Type II acromion. No
significant fluid in the subacromial subdeltoid bursa.

Glenohumeral Joint: Humeral avulsion of the inferior glenohumeral
ligament. Adjacent thickening of the MAGROUN and local edema. Articular
cartilage unremarkable. Small and likely chronic Hill-Sachs defect,
images 11-12 series 3.

Labrum:  Poor definition of the anterior inferior labrum.

Bones: No significant extra-articular osseous abnormalities
identified.

Other: No supplemental non-categorized findings.
IMPRESSION: 1. Humeral avulsion of the inferior glenohumeral ligament.
2. Poor definition of the adjacent anterior inferior labrum,
potentially incidental but Granados Carrington tear is not excluded.
3. Small chronic appearing Hill-Sachs impaction, no surrounding
marrow edema currently.
4. There is accentuated T2 signal in the deltoid. Cause uncertain
but conceivably this could be due to some form of brachial neuritis.
5. Mild supraspinatus and biceps tendinopathy.

## 2019-06-11 ENCOUNTER — Ambulatory Visit
Admission: EM | Admit: 2019-06-11 | Discharge: 2019-06-11 | Disposition: A | Discharge status: Home / Self Care | Payer: BC Managed Care – PPO | Attending: Emergency Medicine | Admitting: Emergency Medicine

## 2019-06-11 ENCOUNTER — Encounter: Payer: Self-pay | Admitting: Emergency Medicine

## 2019-06-11 ENCOUNTER — Other Ambulatory Visit: Payer: Self-pay

## 2019-06-11 DIAGNOSIS — R5383 Other fatigue: Secondary | ICD-10-CM

## 2019-06-11 DIAGNOSIS — Z20822 Contact with and (suspected) exposure to covid-19: Secondary | ICD-10-CM | POA: Diagnosis not present

## 2019-06-11 DIAGNOSIS — J029 Acute pharyngitis, unspecified: Secondary | ICD-10-CM

## 2019-06-11 LAB — POC SARS CORONAVIRUS 2 AG -  ED: SARS Coronavirus 2 Ag: NEGATIVE

## 2019-06-11 MED ORDER — LIDOCAINE VISCOUS HCL 2 % MT SOLN: 15.0000 mL | OROMUCOSAL | 0 refills | Status: AC | PRN

## 2019-06-11 NOTE — Discharge Instructions (Signed)
Your COVID test is pending - it is important to quarantine / isolate at home until your results are back. °If you test positive and would like further evaluation for persistent or worsening symptoms, you may schedule an E-visit or virtual (video) visit throughout the [facility] MyChart app or website. ° °PLEASE NOTE: If you develop severe chest pain or shortness of breath please go to the ER or call [phone] for further evaluation --> DO NOT schedule electronic or virtual visits for this. °Please call our office for further guidance / recommendations as needed. °

## 2019-06-11 NOTE — ED Provider Notes (Signed)
[facility]    CSN: [id number] Arrival date & time: 06/11/19  7782      History   Chief Complaint Chief Complaint  Patient presents with  . Sore Throat    HPI Todd Thornton is a 34 y.o. male presenting for sore throat since yesterday.  Patient reporting subjective lymphadenopathy that is tender.  Denying cough, fever, shortness of breath.  No known Covid contact, though patient does work as a IT sales professional.  States he supposed to be at work tomorrow, requesting rapid test today.  Denies history of dental or peritonsillar abscess.  Has not taken anything for symptoms.   History reviewed. No pertinent past medical history.  There are no problems to display for this patient.   History reviewed. No pertinent surgical history.     Home Medications    Prior to Admission medications   Medication Sig Start Date End Date Taking? Authorizing Provider  ibuprofen (ADVIL,MOTRIN) 600 MG tablet Take 1 tablet (600 mg total) by mouth every 6 (six) hours as needed. 04/10/16   Antony Madura, PA-C  lidocaine (XYLOCAINE) 2 % solution Use as directed 15 mLs in the mouth or throat as needed for mouth pain. 06/11/19   Hall-Potvin, [place], PA-C    Family History Family History  Problem Relation Age of Onset  . Cancer Mother   . Breast cancer Mother   . Thyroid cancer Mother   . Hypertension Father     Social History Social History   Tobacco Use  . Smoking status: Never Smoker  . Smokeless tobacco: Never Used  Substance Use Topics  . Alcohol use: No  . Drug use: No     Allergies   Penicillins   Review of Systems Review of Systems  Constitutional: Positive for fatigue. Negative for activity change, appetite change and fever.  HENT: Positive for sore throat. Negative for congestion, dental problem, ear pain, facial swelling, hearing loss, sinus pain, trouble swallowing and voice change.   Eyes: Negative for photophobia, pain and visual disturbance.  Respiratory:  Negative for cough and shortness of breath.   Cardiovascular: Negative for chest pain and palpitations.  Gastrointestinal: Negative for diarrhea and vomiting.  Musculoskeletal: Negative for arthralgias and myalgias.  Neurological: Negative for dizziness and headaches.     Physical Exam Triage Vital Signs ED Triage Vitals  Enc Vitals Group     BP      Pulse      Resp      Temp      Temp src      SpO2      Weight      Height      Head Circumference      Peak Flow      Pain Score      Pain Loc      Pain Edu?      Excl. in GC?    No data found.  Updated Vital Signs BP (!) 151/95 (BP Location: Left Arm)   Pulse 86   Temp 98.5 F (36.9 C) (Temporal)   Resp 18   SpO2 97%   Visual Acuity Right Eye Distance:   Left Eye Distance:   Bilateral Distance:    Right Eye Near:   Left Eye Near:    Bilateral Near:     Physical Exam Constitutional:      General: He is not in acute distress.    Appearance: He is well-developed. He is obese. He is not ill-appearing or diaphoretic.  HENT:  Head: Normocephalic and atraumatic.     Right Ear: Tympanic membrane, ear canal and external ear normal.     Left Ear: Tympanic membrane, ear canal and external ear normal.     Nose: No nasal deformity, congestion or rhinorrhea.     Comments: Turbinates nonedematous bilaterally with pink mucosa    Mouth/Throat:     Mouth: Mucous membranes are moist. No oral lesions.     Tongue: Tongue does not deviate from midline.     Pharynx: Oropharynx is clear. Uvula midline. No posterior oropharyngeal erythema or uvula swelling.     Tonsils: No tonsillar exudate or tonsillar abscesses. 2+ on the right. 2+ on the left.  Eyes:     General: No scleral icterus.    Conjunctiva/sclera: Conjunctivae normal.     Pupils: Pupils are equal, round, and reactive to light.  Neck:     Comments: tender Cardiovascular:     Rate and Rhythm: Normal rate and regular rhythm.  Pulmonary:     Effort: Pulmonary  effort is normal. No respiratory distress.     Breath sounds: No wheezing.  Musculoskeletal:     Cervical back: Normal range of motion and neck supple. No muscular tenderness.  Lymphadenopathy:     Cervical: Cervical adenopathy present.  Skin:    Capillary Refill: Capillary refill takes less than 2 seconds.     Coloration: Skin is not pale.     Findings: No rash.  Neurological:     General: No focal deficit present.     Mental Status: He is alert.      UC Treatments / Results  Labs (all labs ordered are listed, but only abnormal results are displayed) Labs Reviewed  POC SARS CORONAVIRUS 2 AG -  ED - Normal  NOVEL CORONAVIRUS, NAA    EKG   Radiology No results found.  Procedures Procedures (including critical care time)  Medications Ordered in UC Medications - No data to display  Initial Impression / Assessment and Plan / UC Course  I have reviewed the triage vital signs and the nursing notes.  Pertinent labs & imaging results that were available during my care of the patient were reviewed by me and considered in my medical decision making (see chart for details).     Patient afebrile, nontoxic in office today.  Rapid Covid done, reviewed by me: Negative - PCR pending.  Strep deferred at this time due to low Centor score.  Reviewed supportive management, will add lidocaine as adjuvant therapy.  Return precautions discussed, patient verbalized understanding and is agreeable to plan. Final Clinical Impressions(s) / UC Diagnoses   Final diagnoses:  Sore throat     Discharge Instructions     Your COVID test is pending - it is important to quarantine / isolate at home until your results are back. If you test positive and would like further evaluation for persistent or worsening symptoms, you may schedule an E-visit or virtual (video) visit throughout the [facility] app or website.  PLEASE NOTE: If you develop severe chest pain or shortness of breath please  go to the ER or call [phone] for further evaluation --> DO NOT schedule electronic or virtual visits for this. Please call our office for further guidance / recommendations as needed.    ED Prescriptions    Medication Sig Dispense Auth. Provider   lidocaine (XYLOCAINE) 2 % solution Use as directed 15 mLs in the mouth or throat as needed for mouth pain. 100 mL Hall-Potvin, [place],  PA-C     PDMP not reviewed this encounter.   Hall-Potvin, [place], [place] 06/11/19 1821

## 2019-06-11 NOTE — ED Triage Notes (Addendum)
Pt presents to [facility] for assessment of sore throat starting yesterday after he ate a spicy teriyaki sauce with lunch.  C/o right neck swelling as well.  Denies cough, denies nasal congestion, denies n/v/d, denies fever.  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

## 2019-06-12 LAB — NOVEL CORONAVIRUS, NAA: SARS-CoV-2, NAA: NOT DETECTED

## 2019-06-14 ENCOUNTER — Ambulatory Visit
Admission: EM | Admit: 2019-06-14 | Discharge: 2019-06-14 | Disposition: A | Discharge status: Home / Self Care | Payer: BC Managed Care – PPO | Attending: Physician Assistant | Admitting: Physician Assistant

## 2019-06-14 DIAGNOSIS — R197 Diarrhea, unspecified: Secondary | ICD-10-CM

## 2019-06-14 DIAGNOSIS — Z20822 Contact with and (suspected) exposure to covid-19: Secondary | ICD-10-CM

## 2019-06-14 DIAGNOSIS — J039 Acute tonsillitis, unspecified: Secondary | ICD-10-CM | POA: Diagnosis present

## 2019-06-14 DIAGNOSIS — R509 Fever, unspecified: Secondary | ICD-10-CM

## 2019-06-14 DIAGNOSIS — R05 Cough: Secondary | ICD-10-CM | POA: Insufficient documentation

## 2019-06-14 DIAGNOSIS — R059 Cough, unspecified: Secondary | ICD-10-CM

## 2019-06-14 LAB — POCT RAPID STREP A (OFFICE): Rapid Strep A Screen: NEGATIVE

## 2019-06-14 MED ORDER — DEXAMETHASONE SODIUM PHOSPHATE 10 MG/ML IJ SOLN
10.0000 mg | Freq: Once | INTRAMUSCULAR | Status: AC
  Administered 2019-06-14: 10 mg via INTRAMUSCULAR

## 2019-06-14 MED ORDER — AZITHROMYCIN 500 MG PO TABS: 500.0000 mg | ORAL_TABLET | Freq: Every day | ORAL | 0 refills | Status: AC

## 2019-06-14 NOTE — ED Triage Notes (Signed)
Pt c/o sore throat with swollen glands making it difficult to swallow.

## 2019-06-14 NOTE — ED Provider Notes (Signed)
[facility]    CSN: [id number] Arrival date & time: 06/14/19  0808      History   Chief Complaint Chief Complaint  Patient presents with  . Sore Throat    HPI Todd Thornton is a 34 y.o. male.   34 year old male returns to clinic for worsening sore throat after being seen 06/11/2019 for same. At the time, he had 1-2 day of symptoms and had rapid negative COVID. PCR COVID negative. Using viscous lidocaine without relief. Sore throat worsening with painful swallowing. No drooling, trismus, tripoding. Denies rhinorrhea, nasal congestion. Dry cough starting today. Fever with tmax of 101. Denies chills, body aches. Diarrhea started yesterday. Denies abdominal pain, nausea, vomiting. Denies shortness of breath, loss of taste/smell.      History reviewed. No pertinent past medical history.  There are no problems to display for this patient.   History reviewed. No pertinent surgical history.     Home Medications    Prior to Admission medications   Medication Sig Start Date End Date Taking? Authorizing Provider  azithromycin (ZITHROMAX) 500 MG tablet Take 1 tablet (500 mg total) by mouth daily for 5 days. 06/14/19 06/19/19  Belinda Fisher, PA-C  ibuprofen (ADVIL,MOTRIN) 600 MG tablet Take 1 tablet (600 mg total) by mouth every 6 (six) hours as needed. 04/10/16   Antony Madura, PA-C  lidocaine (XYLOCAINE) 2 % solution Use as directed 15 mLs in the mouth or throat as needed for mouth pain. 06/11/19   Hall-Potvin, [place], PA-C    Family History Family History  Problem Relation Age of Onset  . Cancer Mother   . Breast cancer Mother   . Thyroid cancer Mother   . Hypertension Father     Social History Social History   Tobacco Use  . Smoking status: Never Smoker  . Smokeless tobacco: Never Used  Substance Use Topics  . Alcohol use: No  . Drug use: No     Allergies   Penicillins   Review of Systems Review of Systems  Reason unable to perform ROS: See HPI as  above.     Physical Exam Triage Vital Signs ED Triage Vitals [06/14/19 0826]  Enc Vitals Group     BP (!) 141/109     Pulse Rate 94     Resp 16     Temp 99.1 F (37.3 C)     Temp Source Oral     SpO2 96 %     Weight      Height      Head Circumference      Peak Flow      Pain Score 9     Pain Loc      Pain Edu?      Excl. in GC?    No data found.  Updated Vital Signs BP (!) 141/109 (BP Location: Left Arm)   Pulse 94   Temp 99.1 F (37.3 C) (Oral)   Resp 16   SpO2 96%   Physical Exam Constitutional:      General: He is not in acute distress.    Appearance: Normal appearance. He is not ill-appearing, toxic-appearing or diaphoretic.  HENT:     Head: Normocephalic and atraumatic.     Right Ear: Ear canal normal. Tympanic membrane is erythematous. Tympanic membrane is not bulging.     Left Ear: Tympanic membrane, ear canal and external ear normal. Tympanic membrane is not erythematous or bulging.     Mouth/Throat:  Mouth: Mucous membranes are moist.     Pharynx: Oropharynx is clear. Uvula midline. Posterior oropharyngeal erythema and uvula swelling present.     Tonsils: No tonsillar exudate. 3+ on the right. 3+ on the left.     Comments: Handling own secretions well.  Cardiovascular:     Rate and Rhythm: Normal rate and regular rhythm.     Heart sounds: Normal heart sounds. No murmur. No friction rub. No gallop.   Pulmonary:     Effort: Pulmonary effort is normal. No accessory muscle usage, prolonged expiration, respiratory distress or retractions.     Comments: Lungs clear to auscultation without adventitious lung sounds. Musculoskeletal:     Cervical back: Normal range of motion and neck supple.  Lymphadenopathy:     Cervical: Cervical adenopathy present.  Neurological:     General: No focal deficit present.     Mental Status: He is alert and oriented to person, place, and time.      UC Treatments / Results  Labs (all labs ordered are listed, but only  abnormal results are displayed) Labs Reviewed  POCT RAPID STREP A (OFFICE) - Normal  CULTURE, GROUP A STREP ([place])  NOVEL CORONAVIRUS, NAA    EKG   Radiology No results found.  Procedures Procedures (including critical care time)  Medications Ordered in UC Medications  dexamethasone (DECADRON) injection 10 mg (10 mg Intramuscular Given 06/14/19 0855)    Initial Impression / Assessment and Plan / UC Course  I have reviewed the triage vital signs and the nursing notes.  Pertinent labs & imaging results that were available during my care of the patient were reviewed by me and considered in my medical decision making (see chart for details).    Rapid strep negative. Patient with enlarged tonsils though equal, worse today compared to exam on 06/11/2019. Swollen uvula, though midline. He is able to speak in full sentences and handling own secretions well. No tripoding, drooling, trismus. No "hot potato voice". Low suspicion for peritonsillar abscess at this time. However, given exam with worsening symptoms, will provide decadron injection in office and cover for tonsillitis with azithromycin.   Given now also with cough, diarrhea, will retest for COVID. COVID PCR sent. Patient to quarantine until testing results return strict return precautions given. Patient expresses understanding and agrees to plan.  Final Clinical Impressions(s) / UC Diagnoses   Final diagnoses:  Cough  Fever, unspecified  Diarrhea, unspecified type  Acute tonsillitis, unspecified etiology   ED Prescriptions    Medication Sig Dispense Auth. Provider   azithromycin (ZITHROMAX) 500 MG tablet Take 1 tablet (500 mg total) by mouth daily for 5 days. 5 tablet Ok Edwards, PA-C     PDMP not reviewed this encounter.   Ok Edwards, PA-C 06/14/19 [phone]

## 2019-06-14 NOTE — Discharge Instructions (Signed)
Rapid strep negative. However, given your exam, will cover you empirically for bacterial infection with azithromycin.  Decadron injection in office today for throat swelling. As discussed, given you have new symptoms, I am retesting you for Covid. Please quarantine until testing results return.  Monitor for any worsening of symptoms, swelling of the throat, trouble breathing, trouble swallowing, leaning forward to breath, drooling, go to the emergency department for further evaluation needed.

## 2019-06-15 LAB — NOVEL CORONAVIRUS, NAA: SARS-CoV-2, NAA: NOT DETECTED

## 2019-06-16 LAB — CULTURE, GROUP A STREP (THRC)

## 2020-02-09 ENCOUNTER — Other Ambulatory Visit: Payer: Self-pay

## 2020-02-09 ENCOUNTER — Emergency Department (HOSPITAL_COMMUNITY)
Admission: EM | Admit: 2020-02-09 | Discharge: 2020-02-09 | Disposition: A | Discharge status: Home / Self Care | Payer: BC Managed Care – PPO | Attending: Emergency Medicine | Admitting: Emergency Medicine

## 2020-02-09 ENCOUNTER — Encounter (HOSPITAL_COMMUNITY): Payer: Self-pay | Admitting: Emergency Medicine

## 2020-02-09 DIAGNOSIS — R110 Nausea: Secondary | ICD-10-CM | POA: Diagnosis not present

## 2020-02-09 DIAGNOSIS — R109 Unspecified abdominal pain: Secondary | ICD-10-CM | POA: Diagnosis present

## 2020-02-09 DIAGNOSIS — Z5321 Procedure and treatment not carried out due to patient leaving prior to being seen by health care provider: Secondary | ICD-10-CM | POA: Diagnosis not present

## 2020-02-09 LAB — COMPREHENSIVE METABOLIC PANEL
ALT: 80 U/L — ABNORMAL HIGH (ref 0–44)
AST: 33 U/L (ref 15–41)
Albumin: 4 g/dL (ref 3.5–5.0)
Alkaline Phosphatase: 47 U/L (ref 38–126)
Anion gap: 11 (ref 5–15)
BUN: 7 mg/dL (ref 6–20)
CO2: 25 mmol/L (ref 22–32)
Calcium: 9.3 mg/dL (ref 8.9–10.3)
Chloride: 102 mmol/L (ref 98–111)
Creatinine, Ser: 1.01 mg/dL (ref 0.61–1.24)
GFR calc Af Amer: >60 mL/min (ref >60)
GFR calc non Af Amer: >60 mL/min (ref >60)
Glucose, Bld: 89 mg/dL (ref 70–99)
Potassium: 3.7 mmol/L (ref 3.5–5.1)
Sodium: 138 mmol/L (ref 135–145)
Total Bilirubin: 0.7 mg/dL (ref 0.3–1.2)
Total Protein: 7.4 g/dL (ref 6.5–8.1)

## 2020-02-09 LAB — CBC
HCT: 46.7 % (ref 39.0–52.0)
Hemoglobin: 15.6 g/dL (ref 13.0–17.0)
MCH: 30.1 pg (ref 26.0–34.0)
MCHC: 33.4 g/dL (ref 30.0–36.0)
MCV: 90 fL (ref 80.0–100.0)
Platelets: 277 10*3/uL (ref 150–400)
RBC: 5.19 MIL/uL (ref 4.22–5.81)
RDW: 13.5 % (ref 11.5–15.5)
WBC: 4.5 10*3/uL (ref 4.0–10.5)
nRBC: 0 % (ref 0.0–0.2)

## 2020-02-09 LAB — URINALYSIS, ROUTINE W REFLEX MICROSCOPIC
Bilirubin Urine: NEGATIVE
Glucose, UA: NEGATIVE mg/dL
Hgb urine dipstick: NEGATIVE
Ketones, ur: NEGATIVE mg/dL
Leukocytes,Ua: NEGATIVE
Nitrite: NEGATIVE
Protein, ur: NEGATIVE mg/dL
Specific Gravity, Urine: 1.025 (ref 1.005–1.030)
pH: 6 (ref 5.0–8.0)

## 2020-02-09 LAB — LIPASE, BLOOD: Lipase: 33 U/L (ref 11–51)

## 2020-02-09 NOTE — ED Notes (Signed)
Pt turned in labels to this writer and advised he is leaving. Triage RN notified.

## 2020-02-09 NOTE — ED Triage Notes (Signed)
Patient here from home reporting right side abd pain radiating around to flank that started this morning. Nausea, no vomiting.

## 2020-02-09 NOTE — ED Notes (Signed)
Patient has a urine culture in the main lab
